# Patient Record
Sex: Male | Born: 2005 | Race: Black or African American | Hispanic: No | Marital: Single | Smoking: Never smoker
Health system: Southern US, Community
[De-identification: ages and names within clinical notes are randomized; demographics above are authoritative.]

## PROBLEM LIST (undated history)

## (undated) DIAGNOSIS — J050 Acute obstructive laryngitis [croup]: Secondary | ICD-10-CM

## (undated) HISTORY — PX: CIRCUMCISION: SHX1350

---
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
## 2025-02-24 ENCOUNTER — Ambulatory Visit: Admitting: Family Medicine
